# Patient Record
Sex: Male | Born: 1970 | Race: White | Hispanic: No | Marital: Married | Smoking: Never smoker
Health system: Southern US, Community
[De-identification: ages and names within clinical notes are randomized; demographics above are authoritative.]

## PROBLEM LIST (undated history)

## (undated) DIAGNOSIS — F419 Anxiety disorder, unspecified: Secondary | ICD-10-CM

## (undated) DIAGNOSIS — I10 Essential (primary) hypertension: Secondary | ICD-10-CM

## (undated) DIAGNOSIS — J302 Other seasonal allergic rhinitis: Secondary | ICD-10-CM

## (undated) HISTORY — DX: Essential (primary) hypertension: I10

## (undated) HISTORY — DX: Other seasonal allergic rhinitis: J30.2

## (undated) HISTORY — DX: Anxiety disorder, unspecified: F41.9

---

## 1991-06-21 HISTORY — PX: KNEE ARTHROSCOPY: SUR90

## 2004-06-20 HISTORY — PX: VASECTOMY: SHX75

## 2004-11-03 ENCOUNTER — Ambulatory Visit (HOSPITAL_COMMUNITY): Admission: RE | Admit: 2004-11-03 | Discharge: 2004-11-03 | Payer: Self-pay | Admitting: Neurology

## 2016-04-15 DIAGNOSIS — Z125 Encounter for screening for malignant neoplasm of prostate: Secondary | ICD-10-CM | POA: Diagnosis not present

## 2016-04-15 DIAGNOSIS — I10 Essential (primary) hypertension: Secondary | ICD-10-CM | POA: Diagnosis not present

## 2016-04-19 DIAGNOSIS — F4320 Adjustment disorder, unspecified: Secondary | ICD-10-CM | POA: Diagnosis not present

## 2016-04-22 DIAGNOSIS — Z0000 Encounter for general adult medical examination without abnormal findings: Secondary | ICD-10-CM | POA: Diagnosis not present

## 2016-04-22 DIAGNOSIS — I10 Essential (primary) hypertension: Secondary | ICD-10-CM | POA: Diagnosis not present

## 2016-04-22 DIAGNOSIS — J302 Other seasonal allergic rhinitis: Secondary | ICD-10-CM | POA: Diagnosis not present

## 2016-04-22 DIAGNOSIS — Z1389 Encounter for screening for other disorder: Secondary | ICD-10-CM | POA: Diagnosis not present

## 2016-04-22 DIAGNOSIS — F430 Acute stress reaction: Secondary | ICD-10-CM | POA: Diagnosis not present

## 2016-04-22 DIAGNOSIS — E784 Other hyperlipidemia: Secondary | ICD-10-CM | POA: Diagnosis not present

## 2016-04-27 ENCOUNTER — Other Ambulatory Visit: Payer: Self-pay | Admitting: Internal Medicine

## 2016-04-28 ENCOUNTER — Other Ambulatory Visit: Payer: Self-pay | Admitting: Internal Medicine

## 2016-04-28 DIAGNOSIS — N63 Unspecified lump in unspecified breast: Secondary | ICD-10-CM

## 2016-05-03 ENCOUNTER — Other Ambulatory Visit: Payer: Self-pay | Admitting: Internal Medicine

## 2016-05-03 DIAGNOSIS — N63 Unspecified lump in unspecified breast: Secondary | ICD-10-CM

## 2016-06-17 DIAGNOSIS — F410 Panic disorder [episodic paroxysmal anxiety] without agoraphobia: Secondary | ICD-10-CM | POA: Diagnosis not present

## 2016-06-17 DIAGNOSIS — H8112 Benign paroxysmal vertigo, left ear: Secondary | ICD-10-CM | POA: Diagnosis not present

## 2016-06-17 DIAGNOSIS — I10 Essential (primary) hypertension: Secondary | ICD-10-CM | POA: Diagnosis not present

## 2016-06-17 DIAGNOSIS — Z6826 Body mass index (BMI) 26.0-26.9, adult: Secondary | ICD-10-CM | POA: Diagnosis not present

## 2016-06-27 DIAGNOSIS — R42 Dizziness and giddiness: Secondary | ICD-10-CM | POA: Diagnosis not present

## 2016-06-27 DIAGNOSIS — H8112 Benign paroxysmal vertigo, left ear: Secondary | ICD-10-CM | POA: Diagnosis not present

## 2016-06-27 DIAGNOSIS — I10 Essential (primary) hypertension: Secondary | ICD-10-CM | POA: Diagnosis not present

## 2016-06-27 DIAGNOSIS — H9191 Unspecified hearing loss, right ear: Secondary | ICD-10-CM | POA: Diagnosis not present

## 2016-06-27 DIAGNOSIS — H5502 Latent nystagmus: Secondary | ICD-10-CM | POA: Diagnosis not present

## 2016-06-28 ENCOUNTER — Other Ambulatory Visit: Payer: Self-pay | Admitting: Internal Medicine

## 2016-06-28 DIAGNOSIS — R42 Dizziness and giddiness: Secondary | ICD-10-CM

## 2016-07-05 ENCOUNTER — Ambulatory Visit
Admission: RE | Admit: 2016-07-05 | Discharge: 2016-07-05 | Disposition: A | Discharge status: Home / Self Care | Payer: BLUE CROSS/BLUE SHIELD | Source: Ambulatory Visit | Attending: Internal Medicine | Admitting: Internal Medicine

## 2016-07-05 DIAGNOSIS — R42 Dizziness and giddiness: Secondary | ICD-10-CM

## 2016-07-05 DIAGNOSIS — I6523 Occlusion and stenosis of bilateral carotid arteries: Secondary | ICD-10-CM | POA: Diagnosis not present

## 2016-07-08 DIAGNOSIS — H9042 Sensorineural hearing loss, unilateral, left ear, with unrestricted hearing on the contralateral side: Secondary | ICD-10-CM | POA: Diagnosis not present

## 2016-07-08 DIAGNOSIS — F1020 Alcohol dependence, uncomplicated: Secondary | ICD-10-CM | POA: Diagnosis not present

## 2016-07-08 DIAGNOSIS — H9312 Tinnitus, left ear: Secondary | ICD-10-CM | POA: Diagnosis not present

## 2016-07-08 DIAGNOSIS — H6121 Impacted cerumen, right ear: Secondary | ICD-10-CM | POA: Diagnosis not present

## 2016-07-13 ENCOUNTER — Ambulatory Visit
Admission: RE | Admit: 2016-07-13 | Discharge: 2016-07-13 | Disposition: A | Discharge status: Home / Self Care | Payer: BLUE CROSS/BLUE SHIELD | Source: Ambulatory Visit | Attending: Internal Medicine | Admitting: Internal Medicine

## 2016-07-13 DIAGNOSIS — N63 Unspecified lump in unspecified breast: Secondary | ICD-10-CM

## 2016-07-13 DIAGNOSIS — N6314 Unspecified lump in the right breast, lower inner quadrant: Secondary | ICD-10-CM | POA: Diagnosis not present

## 2016-07-20 DIAGNOSIS — H9122 Sudden idiopathic hearing loss, left ear: Secondary | ICD-10-CM | POA: Diagnosis not present

## 2016-07-22 DIAGNOSIS — H9122 Sudden idiopathic hearing loss, left ear: Secondary | ICD-10-CM | POA: Diagnosis not present

## 2016-07-26 DIAGNOSIS — H9122 Sudden idiopathic hearing loss, left ear: Secondary | ICD-10-CM | POA: Diagnosis not present

## 2016-07-29 DIAGNOSIS — H9122 Sudden idiopathic hearing loss, left ear: Secondary | ICD-10-CM | POA: Diagnosis not present

## 2016-08-10 DIAGNOSIS — H9122 Sudden idiopathic hearing loss, left ear: Secondary | ICD-10-CM | POA: Diagnosis not present

## 2016-08-10 DIAGNOSIS — H9042 Sensorineural hearing loss, unilateral, left ear, with unrestricted hearing on the contralateral side: Secondary | ICD-10-CM | POA: Diagnosis not present

## 2016-08-12 ENCOUNTER — Other Ambulatory Visit: Payer: Self-pay | Admitting: Otolaryngology

## 2016-08-12 DIAGNOSIS — H9122 Sudden idiopathic hearing loss, left ear: Secondary | ICD-10-CM

## 2016-08-21 ENCOUNTER — Other Ambulatory Visit: Payer: BLUE CROSS/BLUE SHIELD

## 2016-08-21 ENCOUNTER — Ambulatory Visit
Admission: RE | Admit: 2016-08-21 | Discharge: 2016-08-21 | Disposition: A | Discharge status: Home / Self Care | Payer: BLUE CROSS/BLUE SHIELD | Source: Ambulatory Visit | Attending: Otolaryngology | Admitting: Otolaryngology

## 2016-08-21 DIAGNOSIS — H9122 Sudden idiopathic hearing loss, left ear: Secondary | ICD-10-CM

## 2016-08-21 DIAGNOSIS — H9192 Unspecified hearing loss, left ear: Secondary | ICD-10-CM | POA: Diagnosis not present

## 2016-08-21 MED ORDER — GADOBENATE DIMEGLUMINE 529 MG/ML IV SOLN
18.0000 mL | Freq: Once | INTRAVENOUS | Status: AC | PRN
  Administered 2016-08-21: 18 mL via INTRAVENOUS

## 2016-11-07 DIAGNOSIS — H9122 Sudden idiopathic hearing loss, left ear: Secondary | ICD-10-CM | POA: Diagnosis not present

## 2016-11-16 DIAGNOSIS — R070 Pain in throat: Secondary | ICD-10-CM | POA: Diagnosis not present

## 2016-11-16 DIAGNOSIS — I10 Essential (primary) hypertension: Secondary | ICD-10-CM | POA: Diagnosis not present

## 2017-04-28 DIAGNOSIS — Z125 Encounter for screening for malignant neoplasm of prostate: Secondary | ICD-10-CM | POA: Diagnosis not present

## 2017-04-28 DIAGNOSIS — I10 Essential (primary) hypertension: Secondary | ICD-10-CM | POA: Diagnosis not present

## 2017-04-28 DIAGNOSIS — Z0000 Encounter for general adult medical examination without abnormal findings: Secondary | ICD-10-CM | POA: Diagnosis not present

## 2017-05-04 DIAGNOSIS — Z1389 Encounter for screening for other disorder: Secondary | ICD-10-CM | POA: Diagnosis not present

## 2017-05-04 DIAGNOSIS — Z0000 Encounter for general adult medical examination without abnormal findings: Secondary | ICD-10-CM | POA: Diagnosis not present

## 2017-05-04 DIAGNOSIS — I10 Essential (primary) hypertension: Secondary | ICD-10-CM | POA: Diagnosis not present

## 2017-05-04 DIAGNOSIS — F430 Acute stress reaction: Secondary | ICD-10-CM | POA: Diagnosis not present

## 2017-05-04 DIAGNOSIS — Z125 Encounter for screening for malignant neoplasm of prostate: Secondary | ICD-10-CM | POA: Diagnosis not present

## 2017-05-04 DIAGNOSIS — H9190 Unspecified hearing loss, unspecified ear: Secondary | ICD-10-CM | POA: Diagnosis not present

## 2017-05-04 DIAGNOSIS — E7849 Other hyperlipidemia: Secondary | ICD-10-CM | POA: Diagnosis not present

## 2017-05-05 ENCOUNTER — Other Ambulatory Visit: Payer: Self-pay | Admitting: Internal Medicine

## 2017-05-05 DIAGNOSIS — E785 Hyperlipidemia, unspecified: Secondary | ICD-10-CM

## 2017-05-16 DIAGNOSIS — Z1212 Encounter for screening for malignant neoplasm of rectum: Secondary | ICD-10-CM | POA: Diagnosis not present

## 2017-05-31 ENCOUNTER — Ambulatory Visit
Admission: RE | Admit: 2017-05-31 | Discharge: 2017-05-31 | Disposition: A | Discharge status: Home / Self Care | Payer: No Typology Code available for payment source | Source: Ambulatory Visit | Attending: Internal Medicine | Admitting: Internal Medicine

## 2017-05-31 DIAGNOSIS — E785 Hyperlipidemia, unspecified: Secondary | ICD-10-CM

## 2017-10-03 IMAGING — MR MR MRA NECK WO/W CM
12 of 14 series · 34 of 48 positions shown · IV contrast (multihance)
Comparison: Carotid Doppler ultrasound 07/05/2016

CLINICAL DATA: Sudden onset left hearing loss 2 months ago. History
of carotid dissection.

Creatinine was obtained on site at [HOSPITAL] at [HOSPITAL].Results: Creatinine 1.1 mg/dL.
EXAM:
MRI HEAD WITHOUT CONTRAST
MRA NECK WITHOUT AND WITH CONTRAST
TECHNIQUE: Multiplanar, multiecho pulse sequences of the brain and surrounding
structures were obtained without intravenous contrast. Angiographic
images of the neck were obtained using MRA technique with and
without intravenous contrast. Carotid stenosis measurements (when
applicable) are obtained utilizing NASCET criteria, using the distal
internal carotid diameter as the denominator.
CONTRAST:  18mL MULTIHANCE GADOBENATE DIMEGLUMINE 529 MG/ML IV SOLN

[Series 2: T1 · sagittal · 5.0mm · 0.45mm/px · 1 of 21 slices shown (1 of 3)]
[im 1/21]
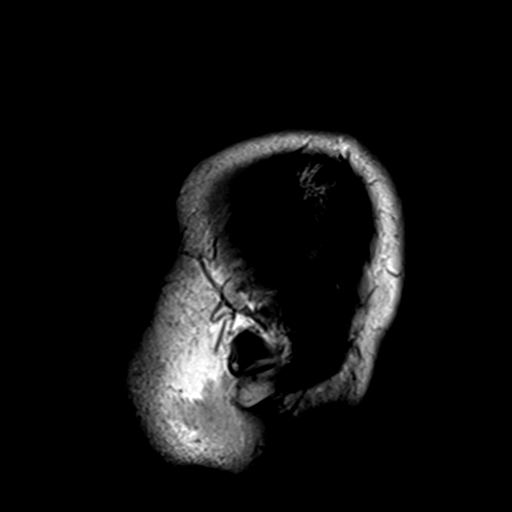

[Series 3: DWI · axial · 3.0mm · 1.80mm/px · z∈[-63,+82]mm · 9 of 100 slices shown (1 of 2)]
[im 1/100]
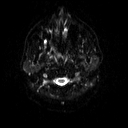
[im 13/100]
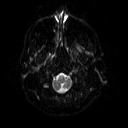
[im 25/100]
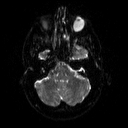
[im 38/100]
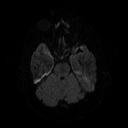
[im 50/100]
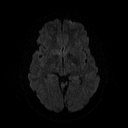
[im 62/100]
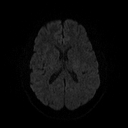
[im 75/100]
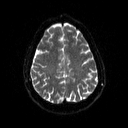
[im 87/100]
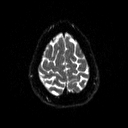
[im 100/100]
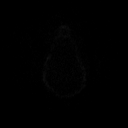

[Series 4: DWI · axial · 3.0mm · 1.80mm/px · z∈[-63,+82]mm · 4 of 50 slices shown (2 of 2)]
[im 1/50]
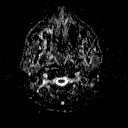
[im 17/50]
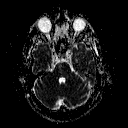
[im 33/50]
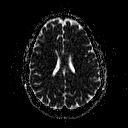
[im 50/50]
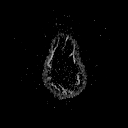

[Series 5: T2 · axial · 5.0mm · 0.45mm/px · z∈[-63,+79]mm · 2 of 23 slices shown]
[im 1/23]
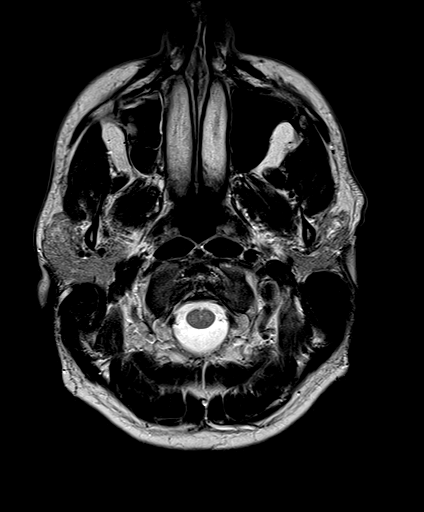
[im 23/23]
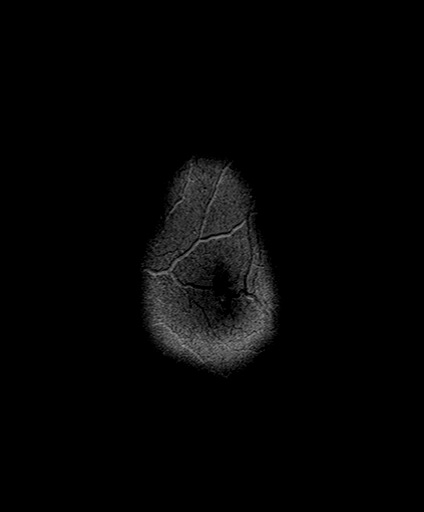

[Series 6: FLAIR · axial · 5.0mm · 0.45mm/px · z∈[-63,+79]mm · 2 of 23 slices shown]
[im 1/23]
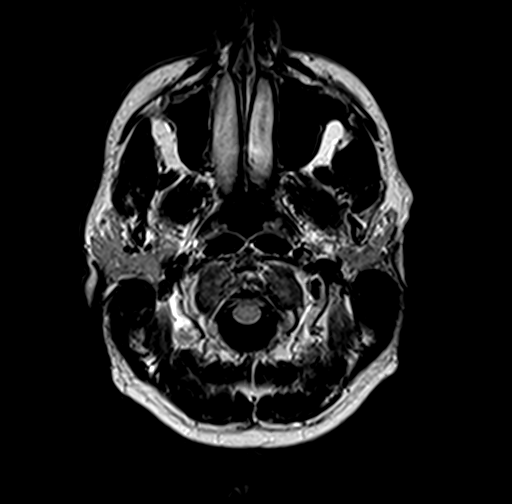
[im 23/23]
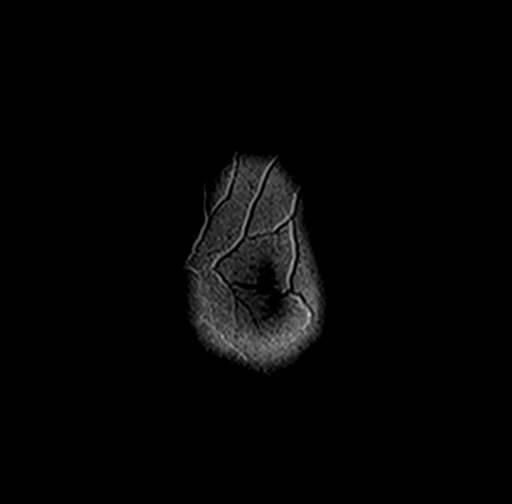

[Series 7: T1 · coronal · 3.0mm · 0.35mm/px · 1 of 11 slices shown (2 of 3)]
[im 1/11]
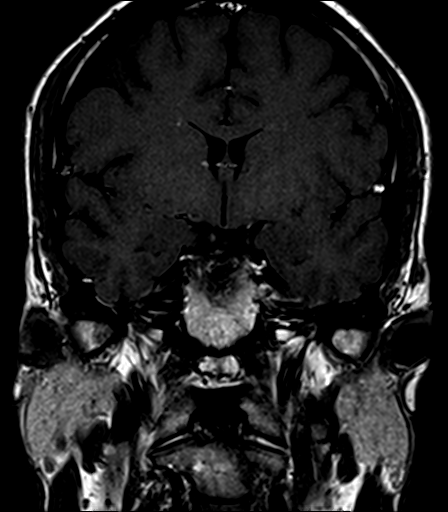

[Series 8: T1 · axial · 3.0mm · 0.35mm/px · 1 of 11 slices shown (3 of 3)]
[im 1/11]
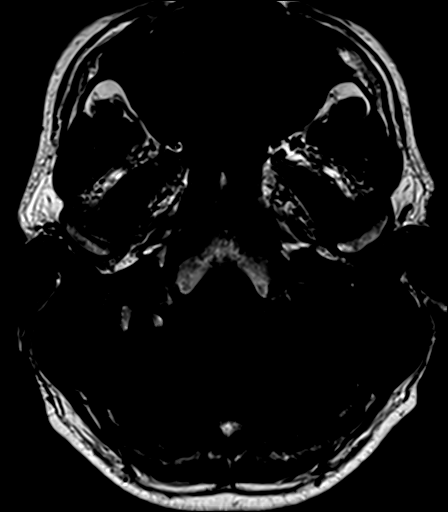

[Series 9: bSSFP · axial · 1.0mm · 0.28mm/px · z∈[-47,-12]mm · 3 of 36 slices shown]
[im 1/36]
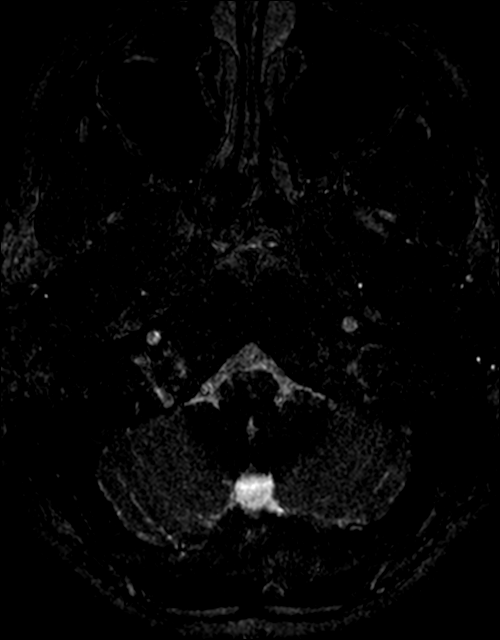
[im 18/36]
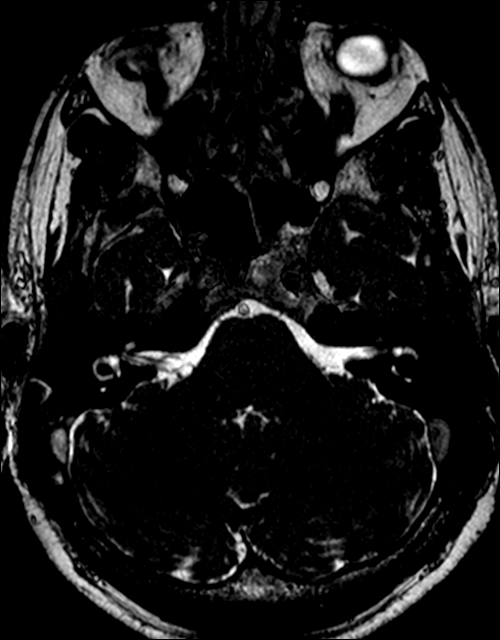
[im 36/36]
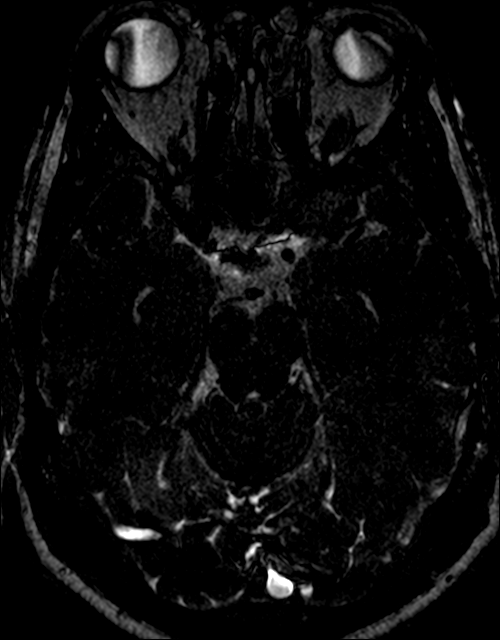

[Series 13: fl_tof_2d · axial · 3.0mm · 0.39mm/px · z∈[-151,-52]mm · 4 of 50 slices shown]
[im 1/50]
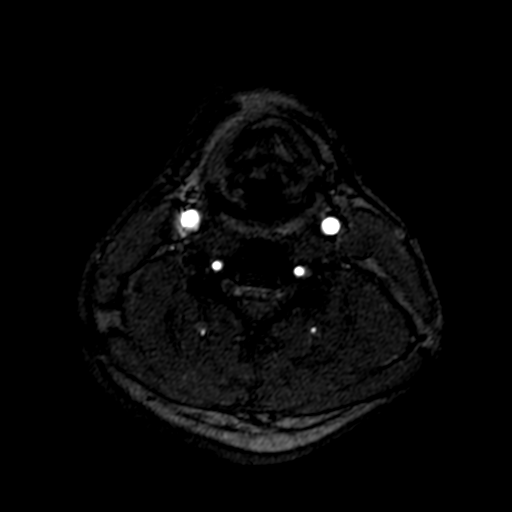
[im 17/50]
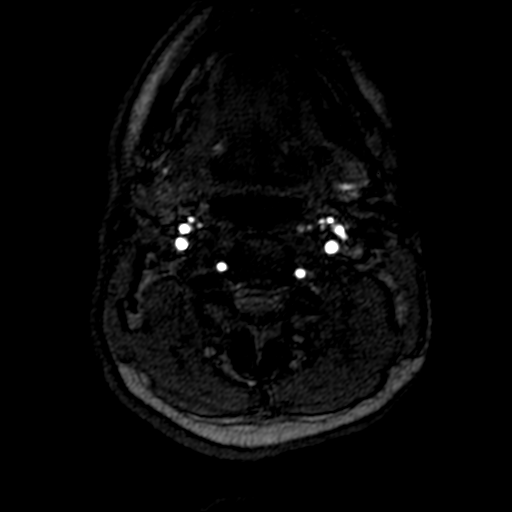
[im 33/50]
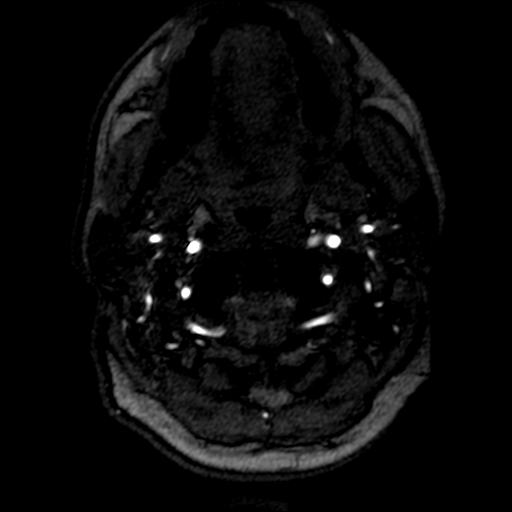
[im 50/50]
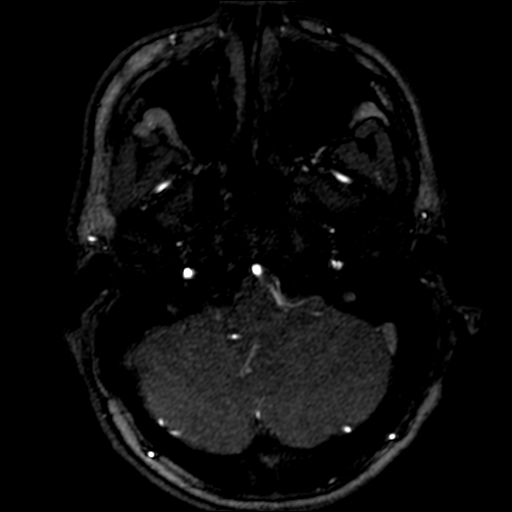

[Series 17: (id)_tt=1.0s · coronal · 0.8mm · 0.78mm/px · 5 of 80 slices shown]
[im 1/80]
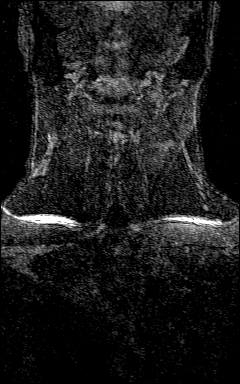
[im 14/80]
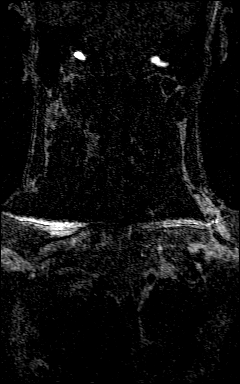
[im 27/80]
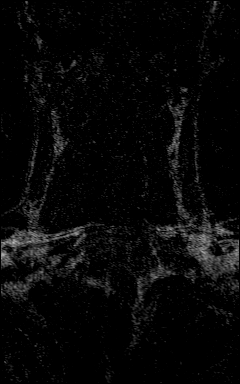
[im 40/80]
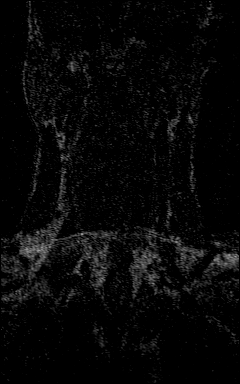
[im 53/80]
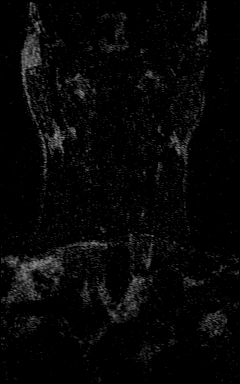

[Series 22: T1 post-contrast · coronal · 3.0mm · 0.35mm/px · 1 of 11 slices shown (1 of 2)]
[im 1/11]
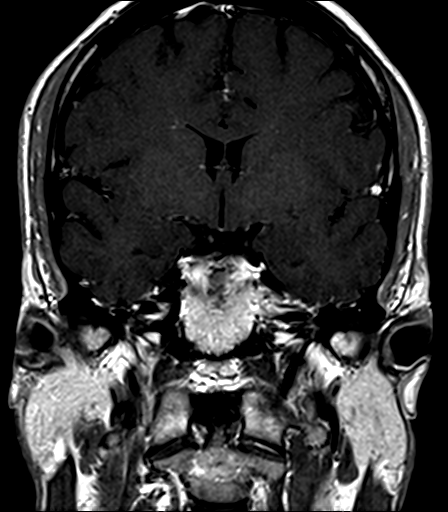

[Series 25: T1 post-contrast · axial · 3.0mm · 0.35mm/px · 1 of 11 slices shown (2 of 2)]
[im 1/11]
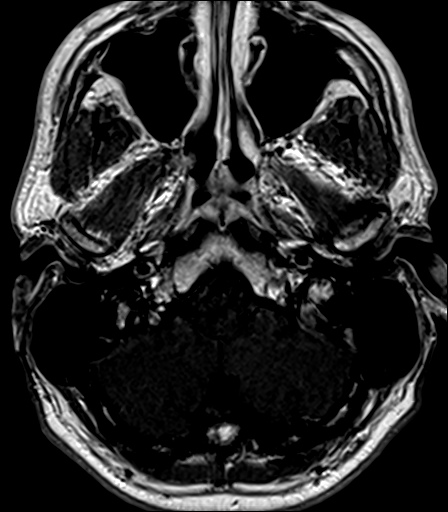

[34 of 48 positions shown; findings below may reference images not displayed]

FINDINGS: MRI HEAD FINDINGS

Brain: There is no evidence of acute infarct, intracranial
hemorrhage, mass, midline shift, or extra-axial fluid collection.
The ventricles and sulci are normal. The brain is normal in signal.
No abnormal enhancement is identified.

Dedicated imaging through the internal auditory canals demonstrates
a normal course of cranial nerves VII and VIII without evidence of
mass or abnormal enhancement. Inner ear structures demonstrate
normal signal bilaterally. No mass is seen within the
cerebellopontine angles.

Vascular: Major intracranial vascular flow voids are preserved.

Skull and upper cervical spine: Unremarkable bone marrow signal.

Sinuses/Orbits: Unremarkable orbits. Moderate mucosal thickening in
the frontal and ethmoid sinuses bilaterally. Clear mastoid air
cells.

Other: None.

MRA NECK FINDINGS

Standard 3 vessel aortic arch. Brachiocephalic and subclavian
arteries are widely patent. The carotid arteries are widely patent
without evidence of stenosis or dissection. The vertebral artery is
are patent and codominant without evidence of stenosis or
dissection.
IMPRESSION: 1. Unremarkable appearance of the brain and internal auditory
canals. No evidence of vestibular schwannoma.
2. Unremarkable neck MRA.

## 2018-04-16 DIAGNOSIS — M25561 Pain in right knee: Secondary | ICD-10-CM | POA: Diagnosis not present

## 2018-05-04 DIAGNOSIS — R82998 Other abnormal findings in urine: Secondary | ICD-10-CM | POA: Diagnosis not present

## 2018-05-04 DIAGNOSIS — I10 Essential (primary) hypertension: Secondary | ICD-10-CM | POA: Diagnosis not present

## 2018-05-04 DIAGNOSIS — Z0000 Encounter for general adult medical examination without abnormal findings: Secondary | ICD-10-CM | POA: Diagnosis not present

## 2018-05-04 DIAGNOSIS — Z125 Encounter for screening for malignant neoplasm of prostate: Secondary | ICD-10-CM | POA: Diagnosis not present

## 2018-05-10 DIAGNOSIS — Z0000 Encounter for general adult medical examination without abnormal findings: Secondary | ICD-10-CM | POA: Diagnosis not present

## 2018-05-10 DIAGNOSIS — E7849 Other hyperlipidemia: Secondary | ICD-10-CM | POA: Diagnosis not present

## 2018-05-10 DIAGNOSIS — H9190 Unspecified hearing loss, unspecified ear: Secondary | ICD-10-CM | POA: Diagnosis not present

## 2018-05-10 DIAGNOSIS — F430 Acute stress reaction: Secondary | ICD-10-CM | POA: Diagnosis not present

## 2018-05-10 DIAGNOSIS — I10 Essential (primary) hypertension: Secondary | ICD-10-CM | POA: Diagnosis not present

## 2018-05-10 DIAGNOSIS — Z1389 Encounter for screening for other disorder: Secondary | ICD-10-CM | POA: Diagnosis not present

## 2018-05-28 DIAGNOSIS — M25561 Pain in right knee: Secondary | ICD-10-CM | POA: Diagnosis not present

## 2018-05-28 DIAGNOSIS — M9902 Segmental and somatic dysfunction of thoracic region: Secondary | ICD-10-CM | POA: Diagnosis not present

## 2018-05-28 DIAGNOSIS — M9904 Segmental and somatic dysfunction of sacral region: Secondary | ICD-10-CM | POA: Diagnosis not present

## 2018-05-28 DIAGNOSIS — M9903 Segmental and somatic dysfunction of lumbar region: Secondary | ICD-10-CM | POA: Diagnosis not present

## 2018-09-06 DIAGNOSIS — D2262 Melanocytic nevi of left upper limb, including shoulder: Secondary | ICD-10-CM | POA: Diagnosis not present

## 2018-09-06 DIAGNOSIS — L821 Other seborrheic keratosis: Secondary | ICD-10-CM | POA: Diagnosis not present

## 2018-09-06 DIAGNOSIS — D2261 Melanocytic nevi of right upper limb, including shoulder: Secondary | ICD-10-CM | POA: Diagnosis not present

## 2018-09-06 DIAGNOSIS — D1801 Hemangioma of skin and subcutaneous tissue: Secondary | ICD-10-CM | POA: Diagnosis not present

## 2018-10-11 DIAGNOSIS — Z79899 Other long term (current) drug therapy: Secondary | ICD-10-CM | POA: Diagnosis not present

## 2018-10-11 DIAGNOSIS — L609 Nail disorder, unspecified: Secondary | ICD-10-CM | POA: Diagnosis not present

## 2019-02-14 DIAGNOSIS — I10 Essential (primary) hypertension: Secondary | ICD-10-CM | POA: Diagnosis not present

## 2019-02-14 DIAGNOSIS — R6884 Jaw pain: Secondary | ICD-10-CM | POA: Diagnosis not present

## 2019-05-13 DIAGNOSIS — I10 Essential (primary) hypertension: Secondary | ICD-10-CM | POA: Diagnosis not present

## 2019-05-13 DIAGNOSIS — E7849 Other hyperlipidemia: Secondary | ICD-10-CM | POA: Diagnosis not present

## 2019-05-13 DIAGNOSIS — Z125 Encounter for screening for malignant neoplasm of prostate: Secondary | ICD-10-CM | POA: Diagnosis not present

## 2019-05-13 DIAGNOSIS — Z0000 Encounter for general adult medical examination without abnormal findings: Secondary | ICD-10-CM | POA: Diagnosis not present

## 2019-05-20 DIAGNOSIS — H9190 Unspecified hearing loss, unspecified ear: Secondary | ICD-10-CM | POA: Diagnosis not present

## 2019-05-20 DIAGNOSIS — F430 Acute stress reaction: Secondary | ICD-10-CM | POA: Diagnosis not present

## 2019-05-20 DIAGNOSIS — Z0000 Encounter for general adult medical examination without abnormal findings: Secondary | ICD-10-CM | POA: Diagnosis not present

## 2019-05-20 DIAGNOSIS — Z1331 Encounter for screening for depression: Secondary | ICD-10-CM | POA: Diagnosis not present

## 2019-05-20 DIAGNOSIS — E785 Hyperlipidemia, unspecified: Secondary | ICD-10-CM | POA: Diagnosis not present

## 2019-05-20 DIAGNOSIS — I10 Essential (primary) hypertension: Secondary | ICD-10-CM | POA: Diagnosis not present

## 2019-05-20 DIAGNOSIS — R82998 Other abnormal findings in urine: Secondary | ICD-10-CM | POA: Diagnosis not present

## 2019-06-11 DIAGNOSIS — Z1212 Encounter for screening for malignant neoplasm of rectum: Secondary | ICD-10-CM | POA: Diagnosis not present

## 2019-11-13 DIAGNOSIS — D2261 Melanocytic nevi of right upper limb, including shoulder: Secondary | ICD-10-CM | POA: Diagnosis not present

## 2019-11-13 DIAGNOSIS — B351 Tinea unguium: Secondary | ICD-10-CM | POA: Diagnosis not present

## 2019-11-13 DIAGNOSIS — D2262 Melanocytic nevi of left upper limb, including shoulder: Secondary | ICD-10-CM | POA: Diagnosis not present

## 2019-11-13 DIAGNOSIS — D1801 Hemangioma of skin and subcutaneous tissue: Secondary | ICD-10-CM | POA: Diagnosis not present

## 2020-06-25 DIAGNOSIS — E785 Hyperlipidemia, unspecified: Secondary | ICD-10-CM | POA: Diagnosis not present

## 2020-06-25 DIAGNOSIS — I10 Essential (primary) hypertension: Secondary | ICD-10-CM | POA: Diagnosis not present

## 2020-06-25 DIAGNOSIS — Z20828 Contact with and (suspected) exposure to other viral communicable diseases: Secondary | ICD-10-CM | POA: Diagnosis not present

## 2020-12-30 DIAGNOSIS — D2262 Melanocytic nevi of left upper limb, including shoulder: Secondary | ICD-10-CM | POA: Diagnosis not present

## 2020-12-30 DIAGNOSIS — D224 Melanocytic nevi of scalp and neck: Secondary | ICD-10-CM | POA: Diagnosis not present

## 2020-12-30 DIAGNOSIS — L570 Actinic keratosis: Secondary | ICD-10-CM | POA: Diagnosis not present

## 2020-12-30 DIAGNOSIS — D225 Melanocytic nevi of trunk: Secondary | ICD-10-CM | POA: Diagnosis not present

## 2020-12-30 DIAGNOSIS — D2261 Melanocytic nevi of right upper limb, including shoulder: Secondary | ICD-10-CM | POA: Diagnosis not present

## 2021-05-01 DIAGNOSIS — R030 Elevated blood-pressure reading, without diagnosis of hypertension: Secondary | ICD-10-CM | POA: Diagnosis not present

## 2021-05-01 DIAGNOSIS — J029 Acute pharyngitis, unspecified: Secondary | ICD-10-CM | POA: Diagnosis not present

## 2021-05-01 DIAGNOSIS — R5381 Other malaise: Secondary | ICD-10-CM | POA: Diagnosis not present

## 2021-07-01 DIAGNOSIS — F4322 Adjustment disorder with anxiety: Secondary | ICD-10-CM | POA: Diagnosis not present

## 2021-07-12 DIAGNOSIS — Z125 Encounter for screening for malignant neoplasm of prostate: Secondary | ICD-10-CM | POA: Diagnosis not present

## 2021-07-12 DIAGNOSIS — E785 Hyperlipidemia, unspecified: Secondary | ICD-10-CM | POA: Diagnosis not present

## 2021-07-16 DIAGNOSIS — Z8616 Personal history of COVID-19: Secondary | ICD-10-CM | POA: Diagnosis not present

## 2021-07-16 DIAGNOSIS — I10 Essential (primary) hypertension: Secondary | ICD-10-CM | POA: Diagnosis not present

## 2021-07-16 DIAGNOSIS — E785 Hyperlipidemia, unspecified: Secondary | ICD-10-CM | POA: Diagnosis not present

## 2021-07-16 DIAGNOSIS — Z1331 Encounter for screening for depression: Secondary | ICD-10-CM | POA: Diagnosis not present

## 2021-07-16 DIAGNOSIS — R82998 Other abnormal findings in urine: Secondary | ICD-10-CM | POA: Diagnosis not present

## 2021-07-16 DIAGNOSIS — F430 Acute stress reaction: Secondary | ICD-10-CM | POA: Diagnosis not present

## 2021-07-16 DIAGNOSIS — Z1339 Encounter for screening examination for other mental health and behavioral disorders: Secondary | ICD-10-CM | POA: Diagnosis not present

## 2021-07-16 DIAGNOSIS — Z0000 Encounter for general adult medical examination without abnormal findings: Secondary | ICD-10-CM | POA: Diagnosis not present

## 2021-07-23 DIAGNOSIS — F4320 Adjustment disorder, unspecified: Secondary | ICD-10-CM | POA: Diagnosis not present

## 2021-08-27 DIAGNOSIS — F4320 Adjustment disorder, unspecified: Secondary | ICD-10-CM | POA: Diagnosis not present

## 2021-09-07 DIAGNOSIS — F4320 Adjustment disorder, unspecified: Secondary | ICD-10-CM | POA: Diagnosis not present

## 2021-09-14 DIAGNOSIS — F4320 Adjustment disorder, unspecified: Secondary | ICD-10-CM | POA: Diagnosis not present

## 2021-09-28 DIAGNOSIS — F4320 Adjustment disorder, unspecified: Secondary | ICD-10-CM | POA: Diagnosis not present

## 2022-01-10 ENCOUNTER — Encounter: Payer: Self-pay | Admitting: Gastroenterology

## 2022-02-08 ENCOUNTER — Ambulatory Visit (AMBULATORY_SURGERY_CENTER): Payer: Self-pay

## 2022-02-08 VITALS — Ht 73.0 in | Wt 203.0 lb

## 2022-02-08 DIAGNOSIS — Z1211 Encounter for screening for malignant neoplasm of colon: Secondary | ICD-10-CM

## 2022-02-08 MED ORDER — NA SULFATE-K SULFATE-MG SULF 17.5-3.13-1.6 GM/177ML PO SOLN: 1.0000 | Freq: Once | ORAL | 0 refills | Status: AC

## 2022-02-08 NOTE — Progress Notes (Signed)
No egg or soy allergy known to patient  No issues known to pt with past sedation with any surgeries or procedures Patient denies ever being told they had issues or difficulty with intubation  No FH of Malignant Hyperthermia Pt is not on diet pills Pt is not on home 02  Pt is not on blood thinners  Pt denies issues with constipation  No A fib or A flutter Have any cardiac testing pending--NO Pt instructed to use Singlecare.com or GoodRx for a price reduction on prep   

## 2022-02-14 ENCOUNTER — Encounter: Payer: Self-pay | Admitting: Gastroenterology

## 2022-03-01 ENCOUNTER — Ambulatory Visit (AMBULATORY_SURGERY_CENTER): Payer: BC Managed Care – PPO | Admitting: Gastroenterology

## 2022-03-01 ENCOUNTER — Encounter: Payer: Self-pay | Admitting: Gastroenterology

## 2022-03-01 VITALS — BP 113/72 | HR 55 | Temp 96.8°F | Resp 14 | Ht 73.0 in | Wt 203.0 lb

## 2022-03-01 DIAGNOSIS — Z1211 Encounter for screening for malignant neoplasm of colon: Secondary | ICD-10-CM | POA: Diagnosis not present

## 2022-03-01 MED ORDER — SODIUM CHLORIDE 0.9 % IV SOLN: 500.0000 mL | INTRAVENOUS | Status: DC

## 2022-03-01 NOTE — Op Note (Signed)
[facility] Patient Name: Jonathon Carter Procedure Date: 03/01/2022 9:28 AM MRN: [id number] Endoscopist: Sherilyn Cooter L. Myrtie Neither , MD Age: 51 Referring MD:  Date of Birth: 1971-01-18 Gender: Male Account #: [id number] Procedure:                Colonoscopy Indications:              Screening for colorectal malignant neoplasm, This                            is the patient's first colonoscopy Medicines:                Monitored Anesthesia Care Procedure:                Pre-Anesthesia Assessment:                           - Prior to the procedure, a History and Physical                            was performed, and patient medications and                            allergies were reviewed. The patient's tolerance of                            previous anesthesia was also reviewed. The risks                            and benefits of the procedure and the sedation                            options and risks were discussed with the patient.                            All questions were answered, and informed consent                            was obtained. Prior Anticoagulants: The patient has                            taken no previous anticoagulant or antiplatelet                            agents. ASA Grade Assessment: II - A patient with                            mild systemic disease. After reviewing the risks                            and benefits, the patient was deemed in                            satisfactory condition to undergo the procedure.  After obtaining informed consent, the colonoscope                            was passed under direct vision. Throughout the                            procedure, the patient's blood pressure, pulse, and                            oxygen saturations were monitored continuously. The                            Olympus CF-HQ190L (Serial# [id number]) Colonoscope was                            introduced through the anus  and advanced to the the                            cecum, identified by appendiceal orifice and                            ileocecal valve. The colonoscopy was performed                            without difficulty. The patient tolerated the                            procedure well. The quality of the bowel                            preparation was excellent. The ileocecal valve,                            appendiceal orifice, and rectum were photographed. Scope In: 9:32:05 AM Scope Out: 9:46:28 AM Scope Withdrawal Time: 0 hours 10 minutes 46 seconds  Total Procedure Duration: 0 hours 14 minutes 23 seconds  Findings:                 The perianal and digital rectal examinations were                            normal.                           Repeat examination of right colon under NBI                            performed.                           The entire examined colon appeared normal on direct                            and retroflexion views. Complications:            No immediate complications. Estimated Blood Loss:  Estimated blood loss: none. Impression:               - The entire examined colon is normal on direct and                            retroflexion views.                           - No specimens collected. Recommendation:           - Patient has a contact number available for                            emergencies. The signs and symptoms of potential                            delayed complications were discussed with the                            patient. Return to normal activities tomorrow.                            Written discharge instructions were provided to the                            patient.                           - Resume previous diet.                           - Continue present medications.                           - Repeat colonoscopy in 10 years for screening                            purposes. Freeda Spivey L. Myrtie Neither, MD 03/01/2022 9:48:55  AM This report has been signed electronically.

## 2022-03-01 NOTE — Progress Notes (Signed)
A and O x3. Report to RN. Tolerated MAC anesthesia well. 

## 2022-03-01 NOTE — Progress Notes (Signed)
History and Physical:  This patient presents for endoscopic testing for: Encounter Diagnosis  Name Primary?   Special screening for malignant neoplasms, colon Yes    Average risk - first screening colonoscopy. Patient denies chronic abdominal pain, rectal bleeding, constipation or diarrhea.    Patient is otherwise without complaints or active issues today.   Past Medical History: Past Medical History:  Diagnosis Date   Anxiety    situational anxiety   Hypertension    on meds   Seasonal allergies      Past Surgical History: Past Surgical History:  Procedure Laterality Date   KNEE ARTHROSCOPY Left 1993   KNEE ARTHROSCOPY Right 1993   VASECTOMY  2006    Allergies: No Known Allergies  Outpatient Meds: Current Outpatient Medications  Medication Sig Dispense Refill   aspirin EC 81 MG tablet Take 1 tablet by mouth daily at 6 (six) AM.     lisinopril (ZESTRIL) 20 MG tablet Take 20 mg by mouth daily.     Omega-3 1000 MG CAPS Take 2 capsules by mouth daily at 6 (six) AM.     co-enzyme Q-10 30 MG capsule Take 1 capsule by mouth daily at 6 (six) AM.     loratadine (CLARITIN) 10 MG tablet Take 1 tablet by mouth daily as needed. Seasonal allergies     LORazepam (ATIVAN) 0.5 MG tablet Take 1 tablet by mouth daily as needed.     Current Facility-Administered Medications  Medication Dose Route Frequency Provider Last Rate Last Admin   0.9 %  sodium chloride infusion  500 mL Intravenous Continuous Danis, Starr Lake III, MD          ___________________________________________________________________ Objective   Exam:  BP 127/85   Pulse 85   Temp (!) 96.8 F (36 C) (Temporal)   Ht 6\' 1"  (1.854 m)   Wt 203 lb (92.1 kg)   SpO2 100%   BMI 26.78 kg/m   CV: RRR without murmur, S1/S2 Resp: clear to auscultation bilaterally, normal RR and effort noted GI: soft, no tenderness, with active bowel sounds.   Assessment: Encounter Diagnosis  Name Primary?   Special screening  for malignant neoplasms, colon Yes     Plan: Colonoscopy  The benefits and risks of the planned procedure were described in detail with the patient or (when appropriate) their health care proxy.  Risks were outlined as including, but not limited to, bleeding, infection, perforation, adverse medication reaction leading to cardiac or pulmonary decompensation, pancreatitis (if ERCP).  The limitation of incomplete mucosal visualization was also discussed.  No guarantees or warranties were given.    The patient is appropriate for an endoscopic procedure in the ambulatory setting.   - , MD

## 2022-03-01 NOTE — Progress Notes (Signed)
Pt's states no medical or surgical changes since previsit or office visit. 

## 2022-03-01 NOTE — Patient Instructions (Signed)
Resume previous diet and continue present medications. Repeat colonoscopy in 10 years for surveillance!  YOU HAD AN ENDOSCOPIC PROCEDURE TODAY AT THE [facility]:   Refer to the procedure report that was given to you for any specific questions about what was found during the examination.  If the procedure report does not answer your questions, please call your gastroenterologist to clarify.  If you requested that your care partner not be given the details of your procedure findings, then the procedure report has been included in a sealed envelope for you to review at your convenience later.  YOU SHOULD EXPECT: Some feelings of bloating in the abdomen. Passage of more gas than usual.  Walking can help get rid of the air that was put into your GI tract during the procedure and reduce the bloating. If you had a lower endoscopy (such as a colonoscopy or flexible sigmoidoscopy) you may notice spotting of blood in your stool or on the toilet paper. If you underwent a bowel prep for your procedure, you may not have a normal bowel movement for a few days.  Please Note:  You might notice some irritation and congestion in your nose or some drainage.  This is from the oxygen used during your procedure.  There is no need for concern and it should clear up in a day or so.  SYMPTOMS TO REPORT IMMEDIATELY:  Following lower endoscopy (colonoscopy or flexible sigmoidoscopy):  Excessive amounts of blood in the stool  Significant tenderness or worsening of abdominal pains  Swelling of the abdomen that is new, acute  Fever of 100F or higher  For urgent or emergent issues, a gastroenterologist can be reached at any hour by calling [phone]. Do not use MyChart messaging for urgent concerns.    DIET:  We do recommend a small meal at first, but then you may proceed to your regular diet.  Drink plenty of fluids but you should avoid alcoholic beverages for 24 hours.  ACTIVITY:  You should plan to  take it easy for the rest of today and you should NOT DRIVE or use heavy machinery until tomorrow (because of the sedation medicines used during the test).    FOLLOW UP: Our staff will call the number listed on your records the next business day following your procedure.  We will call around 7:15- 8:00 am to check on you and address any questions or concerns that you may have regarding the information given to you following your procedure. If we do not reach you, we will leave a message.     If any biopsies were taken you will be contacted by phone or by letter within the next 1-3 weeks.  Please call us at [phone] if you have not heard about the biopsies in 3 weeks.    SIGNATURES/CONFIDENTIALITY: You and/or your care partner have signed paperwork which will be entered into your electronic medical record.  These signatures attest to the fact that that the information above on your After Visit Summary has been reviewed and is understood.  Full responsibility of the confidentiality of this discharge information lies with you and/or your care-partner. 

## 2022-03-02 ENCOUNTER — Telehealth: Payer: Self-pay

## 2022-03-02 NOTE — Telephone Encounter (Signed)
  Follow up Call-     03/01/2022    8:38 AM  Call back number  Post procedure Call Back phone  # [phone]  Permission to leave phone message Yes     Post op call attempted, no answer, left WM.

## 2022-04-22 DIAGNOSIS — F4320 Adjustment disorder, unspecified: Secondary | ICD-10-CM | POA: Diagnosis not present

## 2022-05-06 DIAGNOSIS — F4320 Adjustment disorder, unspecified: Secondary | ICD-10-CM | POA: Diagnosis not present

## 2022-08-15 DIAGNOSIS — I10 Essential (primary) hypertension: Secondary | ICD-10-CM | POA: Diagnosis not present

## 2022-08-15 DIAGNOSIS — E785 Hyperlipidemia, unspecified: Secondary | ICD-10-CM | POA: Diagnosis not present

## 2022-08-15 DIAGNOSIS — Z125 Encounter for screening for malignant neoplasm of prostate: Secondary | ICD-10-CM | POA: Diagnosis not present

## 2022-08-15 DIAGNOSIS — R82998 Other abnormal findings in urine: Secondary | ICD-10-CM | POA: Diagnosis not present

## 2022-08-22 ENCOUNTER — Other Ambulatory Visit: Payer: Self-pay | Admitting: Internal Medicine

## 2022-08-22 DIAGNOSIS — E785 Hyperlipidemia, unspecified: Secondary | ICD-10-CM | POA: Diagnosis not present

## 2022-08-22 DIAGNOSIS — I10 Essential (primary) hypertension: Secondary | ICD-10-CM | POA: Diagnosis not present

## 2022-08-22 DIAGNOSIS — Z23 Encounter for immunization: Secondary | ICD-10-CM | POA: Diagnosis not present

## 2022-08-22 DIAGNOSIS — D179 Benign lipomatous neoplasm, unspecified: Secondary | ICD-10-CM | POA: Diagnosis not present

## 2022-08-22 DIAGNOSIS — Z0000 Encounter for general adult medical examination without abnormal findings: Secondary | ICD-10-CM | POA: Diagnosis not present

## 2022-08-22 DIAGNOSIS — Z8679 Personal history of other diseases of the circulatory system: Secondary | ICD-10-CM | POA: Diagnosis not present

## 2022-08-22 DIAGNOSIS — Z1331 Encounter for screening for depression: Secondary | ICD-10-CM | POA: Diagnosis not present

## 2022-09-21 ENCOUNTER — Ambulatory Visit
Admission: RE | Admit: 2022-09-21 | Discharge: 2022-09-21 | Disposition: A | Discharge status: Home / Self Care | Payer: No Typology Code available for payment source | Source: Ambulatory Visit | Attending: Internal Medicine | Admitting: Internal Medicine

## 2022-09-21 DIAGNOSIS — E785 Hyperlipidemia, unspecified: Secondary | ICD-10-CM

## 2023-09-07 DIAGNOSIS — E785 Hyperlipidemia, unspecified: Secondary | ICD-10-CM | POA: Diagnosis not present

## 2023-09-07 DIAGNOSIS — Z125 Encounter for screening for malignant neoplasm of prostate: Secondary | ICD-10-CM | POA: Diagnosis not present

## 2023-09-15 DIAGNOSIS — Z1331 Encounter for screening for depression: Secondary | ICD-10-CM | POA: Diagnosis not present

## 2023-09-15 DIAGNOSIS — I10 Essential (primary) hypertension: Secondary | ICD-10-CM | POA: Diagnosis not present

## 2023-09-15 DIAGNOSIS — R82998 Other abnormal findings in urine: Secondary | ICD-10-CM | POA: Diagnosis not present

## 2023-09-15 DIAGNOSIS — Z1339 Encounter for screening examination for other mental health and behavioral disorders: Secondary | ICD-10-CM | POA: Diagnosis not present

## 2023-09-15 DIAGNOSIS — Z0000 Encounter for general adult medical examination without abnormal findings: Secondary | ICD-10-CM | POA: Diagnosis not present

## 2023-12-19 DIAGNOSIS — M1711 Unilateral primary osteoarthritis, right knee: Secondary | ICD-10-CM | POA: Diagnosis not present
